# Patient Record
Sex: Female | Born: 1970 | Hispanic: Yes | Marital: Married | Smoking: Never smoker
Health system: Southern US, Community
[De-identification: ages and names within clinical notes are randomized; demographics above are authoritative.]

---

## 2010-02-13 ENCOUNTER — Emergency Department: Payer: Self-pay | Admitting: Emergency Medicine

## 2011-03-26 ENCOUNTER — Ambulatory Visit: Payer: Self-pay | Admitting: Family Medicine

## 2013-05-11 ENCOUNTER — Ambulatory Visit: Payer: Self-pay | Admitting: Family Medicine

## 2014-08-02 ENCOUNTER — Ambulatory Visit: Payer: Self-pay | Admitting: Family Medicine

## 2016-02-18 ENCOUNTER — Other Ambulatory Visit: Payer: Self-pay | Admitting: Family Medicine

## 2016-02-18 DIAGNOSIS — Z1231 Encounter for screening mammogram for malignant neoplasm of breast: Secondary | ICD-10-CM

## 2016-03-02 ENCOUNTER — Ambulatory Visit: Payer: Self-pay

## 2016-03-03 ENCOUNTER — Ambulatory Visit
Admission: RE | Admit: 2016-03-03 | Discharge: 2016-03-03 | Disposition: A | Discharge status: Home / Self Care | Payer: Self-pay | Source: Ambulatory Visit | Attending: Family Medicine | Admitting: Family Medicine

## 2016-03-03 DIAGNOSIS — Z1231 Encounter for screening mammogram for malignant neoplasm of breast: Secondary | ICD-10-CM

## 2016-03-17 ENCOUNTER — Other Ambulatory Visit: Payer: Self-pay | Admitting: Family Medicine

## 2016-03-17 DIAGNOSIS — Z1231 Encounter for screening mammogram for malignant neoplasm of breast: Secondary | ICD-10-CM

## 2016-03-23 ENCOUNTER — Ambulatory Visit
Admission: RE | Admit: 2016-03-23 | Discharge: 2016-03-23 | Disposition: A | Discharge status: Home / Self Care | Payer: Self-pay | Source: Ambulatory Visit | Attending: Family Medicine | Admitting: Family Medicine

## 2016-03-23 DIAGNOSIS — Z1231 Encounter for screening mammogram for malignant neoplasm of breast: Secondary | ICD-10-CM | POA: Insufficient documentation

## 2016-03-30 ENCOUNTER — Other Ambulatory Visit: Payer: Self-pay | Admitting: Family Medicine

## 2016-03-30 DIAGNOSIS — N63 Unspecified lump in unspecified breast: Secondary | ICD-10-CM

## 2016-04-09 ENCOUNTER — Ambulatory Visit
Admission: RE | Admit: 2016-04-09 | Discharge: 2016-04-09 | Disposition: A | Discharge status: Home / Self Care | Payer: Self-pay | Source: Ambulatory Visit | Attending: Family Medicine | Admitting: Family Medicine

## 2016-04-09 DIAGNOSIS — N63 Unspecified lump in unspecified breast: Secondary | ICD-10-CM

## 2017-04-05 ENCOUNTER — Other Ambulatory Visit: Payer: Self-pay | Admitting: Family Medicine

## 2017-04-21 ENCOUNTER — Ambulatory Visit
Admission: RE | Admit: 2017-04-21 | Discharge: 2017-04-21 | Disposition: A | Discharge status: Home / Self Care | Payer: Self-pay | Source: Ambulatory Visit | Attending: Oncology | Admitting: Oncology

## 2017-04-21 ENCOUNTER — Ambulatory Visit: Payer: Self-pay | Attending: Oncology

## 2017-04-21 VITALS — BP 138/96 | HR 97 | Temp 97.3°F | Resp 18 | Ht 65.0 in | Wt 159.0 lb

## 2017-04-21 DIAGNOSIS — Z0000 Encounter for general adult medical examination without abnormal findings: Secondary | ICD-10-CM

## 2017-04-21 NOTE — Progress Notes (Signed)
Subjective:     Patient ID: Shelby Garcia, female   DOB: 1970/11/26, 46 y.o.   MRN: [id number]  HPI   Review of Systems     Objective:   Physical Exam  Pulmonary/Chest: Right breast exhibits no inverted nipple, no mass, no nipple discharge, no skin change and no tenderness. Left breast exhibits no inverted nipple, no mass, no nipple discharge, no skin change and no tenderness. Breasts are symmetrical.       Assessment:     46 year old hispanic patient presents for [facility] clinic visit.  Patient screened, and meets [facility] eligibility.  Patient does not have insurance, Medicare or Medicaid.  Handout given on Affordable Care Act.  Instructed patient on breast self-exam using teach back method.  CBE unremarkable. No mass or lump palpated.  Pelvic exam normal.  Shelby Garcia  Interpreted exam.    Plan:     Sent for bilateral screening mammogram.  Specimen collected for pap.

## 2017-04-26 LAB — PAP LB AND HPV HIGH-RISK
HPV, high-risk: NEGATIVE
PAP SMEAR COMMENT: 0

## 2017-04-27 NOTE — Progress Notes (Signed)
Letter mailed to patient to notify of normal mammogram, and pap smear results. Pap negative/negative.  Next pap due in 5 years. Patient to return for annual [facility] screening.  Copy to [facility].

## 2018-06-29 ENCOUNTER — Encounter (INDEPENDENT_AMBULATORY_CARE_PROVIDER_SITE_OTHER): Payer: Self-pay

## 2018-06-29 ENCOUNTER — Ambulatory Visit
Admission: RE | Admit: 2018-06-29 | Discharge: 2018-06-29 | Disposition: A | Discharge status: Home / Self Care | Payer: Self-pay | Source: Ambulatory Visit | Attending: Oncology | Admitting: Oncology

## 2018-06-29 ENCOUNTER — Encounter: Payer: Self-pay | Admitting: *Deleted

## 2018-06-29 ENCOUNTER — Ambulatory Visit: Payer: Self-pay | Attending: Oncology | Admitting: *Deleted

## 2018-06-29 VITALS — BP 157/108 | HR 86 | Temp 97.8°F | Resp 12 | Ht 65.0 in | Wt 155.0 lb

## 2018-06-29 DIAGNOSIS — Z0000 Encounter for general adult medical examination without abnormal findings: Secondary | ICD-10-CM | POA: Insufficient documentation

## 2018-06-29 NOTE — Patient Instructions (Addendum)
Gave patient hand-out, Women Staying Healthy, Active and Well from [facility], with education on breast health, pap smears, heart and colon health. Hipertensin Hypertension La hipertensin, conocida comnmente como presin arterial alta, se produce cuando la sangre bombea en las arterias con mucha fuerza. Las arterias son los vasos sanguneos que transportan la sangre desde el corazn al resto del cuerpo. La hipertensin hace que el corazn haga ms esfuerzo para bombear sangre y puede provocar que las arterias se estrechen o endurezcan. La hipertensin no tratada o no controlada puede causar infarto de miocardio, accidentes cerebrovasculares, enfermedad renal y otros problemas. Una lectura de la presin arterial consiste de un nmero ms alto sobre un nmero ms bajo. En condiciones ideales, la presin arterial debe estar por debajo de 120/80. El primer nmero ("superior") es la presin sistlica. Es la medida de la presin de las arterias cuando el corazn late. El segundo nmero ("inferior") es la presin diastlica. Es la medida de la presin en las arterias cuando el corazn se relaja. Cules son las causas? Se desconoce la causa de esta afeccin. Qu incrementa el riesgo? Algunos factores de riesgo de hipertensin estn bajo su control. Otros no. Factores que puede modificar  Fumar.  Tener diabetes mellitus tipo 2, colesterol alto, o ambos.  No hacer la cantidad suficiente de actividad fsica o ejercicio.  Tener sobrepeso.  Consumir mucha grasa, azcar, caloras o sal (sodio) en su dieta.  Beber alcohol en exceso. Factores que son difciles o imposibles de modificar  Tener enfermedad renal crnica.  Tener antecedentes familiares de presin arterial alta.  La edad. Los riesgos aumentan con la edad.  La raza. El riesgo es mayor para las personas afroamericanas.  El sexo. Antes de los 45aos, los hombres corren ms riesgo que las mujeres. Despus de los 65aos, las mujeres corren  ms riesgo que los hombres.  Tener apnea obstructiva del sueo.  El estrs. Cules son los signos o los sntomas? La presin arterial extremadamente alta (crisis hipertensiva) puede provocar:  Dolor de cabeza.  Ansiedad.  Falta de aire.  Hemorragia nasal.  Nuseas y vmitos.  Dolor de pecho intenso.  Una crisis de movimientos que no puede controlar (convulsiones).  Cmo se diagnostica? Esta afeccin se diagnostica midiendo su presin arterial mientras se encuentra sentado, con el brazo apoyado sobre una superficie. El brazalete del tensimetro debe colocarse directamente sobre la piel de la parte superior del brazo y al nivel de su corazn. Debe medirla al menos dos veces en el mismo brazo. Determinadas condiciones pueden causar una diferencia de presin arterial entre el brazo izquierdo y el derecho. Ciertos factores pueden provocar que las lecturas de la presin arterial sean inferiores o superiores a lo normal (elevadas) por un perodo corto de tiempo:  Si su presin arterial es ms alta cuando se encuentra en el consultorio del mdico que cuando la mide en su hogar, se denomina "hipertensin de bata blanca". La mayora de las personas que tienen esta afeccin no deben ser medicadas.  Si su presin arterial es ms alta en el hogar que cuando se encuentra en el consultorio del mdico, se denomina "hipertensin enmascarada". La mayora de las personas que tienen esta afeccin deben ser medicadas para controlar la presin arterial.  Si tiene una lecturas de presin arterial alta durante una visita o si tiene presin arterial normal con otros factores de riesgo:  Es posible que se le pida que regrese otro da para volver a controlar su presin arterial.  Se le puede pedir que   se controle la presin arterial en su casa durante 1 semana o ms.  Si se le diagnostica hipertensin, es posible que se le realicen otros anlisis de sangre o estudios de diagnstico por imgenes para  ayudar a su mdico a comprender su riesgo general de tener otras afecciones. Cmo se trata? Esta afeccin se trata haciendo cambios saludables en el estilo de vida, tales como ingerir alimentos saludables, realizar ms ejercicio y reducir el consumo de alcohol. El mdico puede recetarle medicamentos si los cambios en el estilo de vida no son suficientes para lograr controlar la presin arterial y si:  Su presin arterial sistlica est por encima de 130.  Su presin arterial diastlica est por encima de 80.  La presin arterial deseada puede variar en funcin de las enfermedades, la edad y otros factores personales. Siga estas instrucciones en su casa: Comida y bebida  Siga una dieta con alto contenido de fibras y potasio, y con bajo contenido de sodio, azcar agregada y grasas. Un ejemplo de plan alimenticio es la dieta DASH (Dietary Approaches to Stop Hypertension, Mtodos alimenticios para detener la hipertensin). Para alimentarse de esta manera: ? Coma mucha fruta y verdura fresca. Trate de que la mitad del plato de cada comida sea de frutas y verduras. ? Coma cereales integrales, como pasta integral, arroz integral y pan integral. Llene aproximadamente un cuarto del plato con cereales integrales. ? Coma y beba productos lcteos con bajo contenido de grasa, como leche descremada o yogur bajo en grasas. ? Evite la ingesta de cortes de carne grasa, carne procesada o curada, y carne de ave con piel. Llene aproximadamente un cuarto del plato con protenas magras, como pescado, pollo sin piel, frijoles, huevos y tofu. ? Evite ingerir alimentos prehechos o procesados. En general, estos tienen mayor cantidad de sodio, azcar agregada y grasa.  Reduzca su ingesta diaria de sodio. La mayora de las personas que tienen hipertensin deben comer menos de 1500 mg de sodio por da.  Limite el consumo de alcohol a no ms de 1 medida por da si es mujer y no est embarazada y a 2 medidas por da si es  hombre. Una medida equivale a 12onzas de cerveza, 5onzas de vino o 1onzas de bebidas alcohlicas de alta graduacin. Estilo de vida  Trabaje con su mdico para mantener un peso saludable o perder peso. Pregntele cual es su peso recomendado.  Realice al menos 30 minutos de ejercicio que haga que se acelere su corazn (ejercicio aerbico) la mayora de los das de la semana. Estas actividades pueden incluir caminar, nadar o andar en bicicleta.  Incluya ejercicios para fortalecer sus msculos (ejercicios de resistencia), como pilates o levantamiento de pesas, como parte de su rutina semanal de ejercicios. Intente realizar 30minutos de este tipo de ejercicios al menos tres das a la semana.  No consuma ningn producto que contenga nicotina o tabaco, como cigarrillos y cigarrillos electrnicos. Si necesita ayuda para dejar de fumar, consulte al mdico.  Contrlese la presin arterial en su casa segn las indicaciones del mdico.  Concurra a todas las visitas de control como se lo haya indicado el mdico. Esto es importante. Medicamentos  Tome los medicamentos de venta libre y los recetados solamente como se lo haya indicado el mdico. Siga cuidadosamente las indicaciones. Los medicamentos para la presin arterial deben tomarse segn las indicaciones.  No omita las dosis de medicamentos para la presin arterial. Si lo hace, estar en riesgo de tener problemas y puede hacer que los medicamentos   sean menos eficaces.  Pregntele a su mdico a qu efectos secundarios o reacciones a los medicamentos debe prestar atencin. Comunquese con un mdico si:  Piensa que tiene una reaccin a un medicamento que est tomando.  Tiene dolores de cabeza frecuentes (recurrentes).  Siente mareos.  Tiene hinchazn en los tobillos.  Tiene problemas de visin. Solicite ayuda de inmediato si:  Siente un dolor de cabeza intenso o confusin.  Siente debilidad inusual o adormecimiento.  Siente que va a  desmayarse.  Siente un dolor intenso en el pecho o el abdomen.  Vomita repetidas veces.  Tiene dificultad para respirar. Resumen  La hipertensin se produce cuando la sangre bombea en las arterias con mucha fuerza. Si esta afeccin no se controla, podra correr riesgo de tener complicaciones graves.  La presin arterial deseada puede variar en funcin de las enfermedades, la edad y otros factores personales. Para la mayora de las personas, una presin arterial normal es menor que 120/80.  La hipertensin se trata con cambios en el estilo de vida, medicamentos o una combinacin de ambos. Los cambios en el estilo de vida incluyen prdida de peso, ingerir alimentos sanos, seguir una dieta baja en sodio, hacer ms ejercicio y limitar el consumo de alcohol. Esta informacin no tiene como fin reemplazar el consejo del mdico. Asegrese de hacerle al mdico cualquier pregunta que tenga. Document Released: 11/09/2005 Document Revised: 10/21/2016 Document Reviewed: 10/21/2016 Elsevier Interactive Patient Education  2018 Elsevier Inc.  

## 2018-06-29 NOTE — Progress Notes (Signed)
  Subjective:     Patient ID: Norwood LevoMa Luisa Keddy, female   DOB: 05-21-1971, 47 y.o.   MRN: [id number]  HPI   Review of Systems     Objective:   Physical Exam  Pulmonary/Chest: Right breast exhibits no inverted nipple, no mass, no nipple discharge, no skin change and no tenderness. Left breast exhibits no inverted nipple, no mass, no nipple discharge, no skin change and no tenderness.       Assessment:     47 [facility] Hispanic [facility]  Lloyda, the interpreter present during the interview and exam.  Clinical breast exam unremarkable.  Taught self breast awareness.  Last pap on 04/21/17 was negative / negative.  Next pap due in 2023. Blood pressure elevated at  157/108.  She is to recheck her blood pressure at [facility] or [facility], and if remains higher than 140/90 she is to follow-up with her primary care provider.  Hand out on hypertention given to patient.  Patient has been screened for eligibility.  She does not have any insurance, Medicare or Medicaid.  She also meets financial eligibility.  Hand-out given on the Affordable Care Act. Risk Assessment    Risk Scores      06/29/2018   Last edited by: Vanita PandaMiceli, Loretta T, RN   5-year risk: 0.5 %   Lifetime risk: 5.3 %            Plan:     Screening mammogram ordered.  Will follow-up per [facility] protocol.

## 2018-06-29 NOTE — Progress Notes (Signed)
Patints BP elevated. Pateint instructed to recheck blood pressure and contact PCP if it remains elevated. No complaints of head ach.

## 2018-06-30 ENCOUNTER — Encounter: Payer: Self-pay | Admitting: *Deleted

## 2018-06-30 NOTE — Progress Notes (Signed)
Letter mailed from [facility] to inform patient of her normal mammogram results.  Patient is to follow-up with annual screening in one year.  [facility] to [facility]. 

## 2020-05-15 ENCOUNTER — Other Ambulatory Visit: Payer: Self-pay

## 2020-05-15 ENCOUNTER — Ambulatory Visit
Admission: RE | Admit: 2020-05-15 | Discharge: 2020-05-15 | Disposition: A | Discharge status: Home / Self Care | Payer: Self-pay | Source: Ambulatory Visit | Attending: Oncology | Admitting: Oncology

## 2020-05-15 ENCOUNTER — Ambulatory Visit: Payer: Self-pay | Attending: Oncology | Admitting: *Deleted

## 2020-05-15 VITALS — BP 145/87 | HR 85 | Temp 96.8°F | Resp 20 | Ht 64.0 in | Wt 159.6 lb

## 2020-05-15 DIAGNOSIS — Z0000 Encounter for general adult medical examination without abnormal findings: Secondary | ICD-10-CM

## 2020-05-15 NOTE — Progress Notes (Signed)
Left Breast feels burning sensation on the side comes with movement. One aunt from mother side they removed uterus. (Not sure of reason why)

## 2020-05-15 NOTE — Progress Notes (Signed)
  Subjective:     Patient ID: Shelby Garcia, female   DOB: February 03, 1971, 49 y.o.   MRN: [id number]  HPI   Review of Systems     Objective:   Physical Exam Chest:     Breasts:        Right: No swelling, bleeding, inverted nipple, mass, nipple discharge, skin change or tenderness.        Left: No swelling, bleeding, inverted nipple, mass, nipple discharge, skin change or tenderness.     Comments: Complains of left breast burning sensation that radiates to her nipple when "picking up things." Lymphadenopathy:     Upper Body:     Right upper body: No supraclavicular or axillary adenopathy.     Left upper body: No supraclavicular or axillary adenopathy.        Assessment:     49 year old Hispanic female returns to [facility] for annual screening.  Loyda, the interpreter present during the interview and exam.  Patient complains of burning sensation in the left breast that radiates from the lateral breast to her nipple when lifting objects.  States it only last for seconds, and has been happening over the last few weeks.  States she does do a "lot of exercising".  On clinical breast exam there is no dominant mass, skin changes, nipple discharge, or lymphadenopathy.  Taught self breast awareness.  Last pap on 06/29/18 was negative / negative.  Next pap due in 2024.  Patient has been screened for eligibility.  She does not have any insurance, Medicare or Medicaid.  She also meets financial eligibility.   Risk Assessment    Risk Scores      05/15/2020 06/29/2018   Last edited by: Neita Garnet, CMA Vanita Panda, RN   5-year risk: 0.6 % 0.5 %   Lifetime risk: 5.2 % 5.3 %            Plan:     Since the breast pain sounds muscular in nature will proceed with screening mammogram.  I have scheduled patient to return on 07/03/20 @ 11:00 for a repeat clinical breast exam.  Will follow up per [facility] protocol.

## 2020-05-15 NOTE — Patient Instructions (Signed)
Gave patient hand-out, Women Staying Healthy, Active and Well from [facility], with education on breast health, pap smears, heart and colon health. 

## 2020-05-17 ENCOUNTER — Encounter: Payer: Self-pay | Admitting: *Deleted

## 2020-05-17 NOTE — Progress Notes (Signed)
Letter mailed from [facility] to inform patient of her normal mammogram results.  Patient is to follow-up with annual screening in one yea.   Patient returning in 6 weeks for repeat clinical breast exam to reassess her breast pain.

## 2020-07-03 ENCOUNTER — Ambulatory Visit: Payer: Self-pay | Attending: Oncology | Admitting: *Deleted

## 2020-07-03 ENCOUNTER — Other Ambulatory Visit: Payer: Self-pay

## 2020-07-03 VITALS — BP 154/96 | HR 89 | Temp 98.7°F | Ht 64.16 in | Wt 163.1 lb

## 2020-07-03 DIAGNOSIS — N644 Mastodynia: Secondary | ICD-10-CM

## 2020-07-03 NOTE — Patient Instructions (Signed)
Hipertensión en los adultos °Hypertension, Adult °La presión arterial alta (hipertensión) se produce cuando la fuerza de la sangre bombea a través de las arterias con mucha fuerza. Las arterias son los vasos sanguíneos que transportan la sangre desde el corazón al resto del cuerpo. La hipertensión hace que el corazón haga más esfuerzo para bombear sangre y puede provocar que las arterias se estrechen o endurezcan. La hipertensión no tratada o no controlada puede causar infarto de miocardio, insuficiencia cardíaca, accidente cerebrovascular, enfermedad renal y otros problemas. °Una lectura de la presión arterial consta de un número más alto sobre un número más bajo. En condiciones ideales, la presión arterial debe estar por debajo de 120/80. El primer número (“superior”) es la presión sistólica. Es la medida de la presión de las arterias cuando el corazón late. El segundo número (“inferior”) es la presión diastólica. Es la medida de la presión en las arterias cuando el corazón se relaja. °¿Cuáles son las causas? °Se desconoce la causa exacta de esta afección. Hay algunas afecciones que causan presión arterial alta o están relacionadas con ella. °¿Qué incrementa el riesgo? °Algunos factores de riesgo de hipertensión están bajo su control. Los siguientes factores pueden hacer que sea más propenso a desarrollar esta afección: °· Fumar. °· Tener diabetes mellitus tipo 2, colesterol alto, o ambos. °· No hacer la cantidad suficiente de actividad física o ejercicio. °· Tener sobrepeso. °· Consumir mucha grasa, azúcar, calorías o sal (sodio) en su dieta. °· Beber alcohol en exceso. °Algunos factores de riesgo para la presión arterial alta pueden ser difíciles o imposibles de cambiar. Algunos de estos factores son los siguientes: °· Tener enfermedad renal crónica. °· Tener antecedentes familiares de presión arterial alta. °· Edad. Los riesgos aumentan con la edad. °· Raza. El riesgo es mayor para las personas  afroamericanas. °· Sexo. Antes de los 45 años, los hombres corren más riesgo que las mujeres. Después de los 65 años, las mujeres corren más riesgo que los hombres. °· Tener apnea obstructiva del sueño. °· Estrés. °¿Cuáles son los signos o los síntomas? °Es posible que la presión arterial alta puede no cause síntomas. La presión arterial muy alta (crisis hipertensiva) puede provocar: °· Dolor de cabeza. °· Ansiedad. °· Falta de aire. °· Hemorragia nasal. °· Náuseas y vómitos. °· Cambios en la visión. °· Dolor de pecho intenso. °· Convulsiones. °¿Cómo se diagnostica? °Esta afección se diagnostica al medir su presión arterial mientras se encuentra sentado, con el brazo apoyado sobre una superficie plana, las piernas sin cruzar y los pies bien apoyados en el piso. El brazalete del tensiómetro debe colocarse directamente sobre la piel de la parte superior del brazo y al nivel de su corazón. Debe medirla al menos dos veces en el mismo brazo. Determinadas condiciones pueden causar una diferencia de presión arterial entre el brazo izquierdo y el derecho. °Ciertos factores pueden provocar que las lecturas de la presión arterial sean inferiores o superiores a lo normal por un período corto de tiempo: °· Si su presión arterial es más alta cuando se encuentra en el consultorio del médico que cuando la mide en su hogar, se denomina “hipertensión de bata blanca”. La mayoría de las personas que tienen esta afección no deben ser medicadas. °· Si su presión arterial es más alta en el hogar que cuando se encuentra en el consultorio del médico, se denomina “hipertensión enmascarada”. La mayoría de las personas que tienen esta afección deben ser medicadas para controlar la presión arterial. °Si tiene una lecturas de presión arterial alta durante   una visita o si tiene presión arterial normal con otros factores de riesgo, se le podrá pedir que haga lo siguiente: °· Que regrese otro día para volver a controlar su presión arterial  nuevamente. °· Que se controle la presión arterial en su casa durante 1 semana o más. °Si se le diagnostica hipertensión, es posible que se le realicen otros análisis de sangre o estudios de diagnóstico por imágenes para ayudar a su médico a comprender su riesgo general de tener otras afecciones. °¿Cómo se trata? °Esta afección se trata haciendo cambios saludables en el estilo de vida, tales como ingerir alimentos saludables, realizar más ejercicio y reducir el consumo de alcohol. El médico puede recetarle medicamentos si los cambios en el estilo de vida no son suficientes para lograr controlar la presión arterial y si: °· Su presión arterial sistólica está por encima de 130. °· Su presión arterial diastólica está por encima de 80. °La presión arterial deseada puede variar en función de las enfermedades, la edad y otros factores personales. °Siga estas instrucciones en su casa: °Comida y bebida ° °· Siga una dieta con alto contenido de fibras y potasio, y con bajo contenido de sodio, azúcar agregada y grasas. Un ejemplo de plan alimenticio es la dieta DASH (Dietary Approaches to Stop Hypertension, Métodos alimenticios para detener la hipertensión). Para alimentarse de esta manera: °? Coma mucha fruta y verdura fresca. Trate de que la mitad del plato de cada comida sea de frutas y verduras. °? Coma cereales integrales, como pasta integral, arroz integral o pan integral. Llene aproximadamente un cuarto del plato con cereales integrales. °? Coma y beba productos lácteos con bajo contenido de grasa, como leche descremada o yogur bajo en grasas. °? Evite la ingesta de cortes de carne grasa, carne procesada o curada, y carne de ave con piel. Llene aproximadamente un cuarto del plato con proteínas magras, como pescado, pollo sin piel, frijoles, huevos o tofu. °? Evite ingerir alimentos prehechos y procesados. En general, estos tienen mayor cantidad de sodio, azúcar agregada y grasa. °· Reduzca su ingesta diaria de sodio.  La mayoría de las personas que tienen hipertensión deben comer menos de 1500 mg de sodio por día. °· No beba alcohol si: °? Su médico le indica no hacerlo. °? Está embarazada, puede estar embarazada o está tratando de quedar embarazada. °· Si bebe alcohol: °? Limite la cantidad que bebe a lo siguiente: °§ De 0 a 1 medida por día para las mujeres. °§ De 0 a 2 medidas por día para los hombres. °? Esté atento a la cantidad de alcohol que hay en las bebidas que toma. En [place], una medida equivale a una botella de cerveza de 12 oz (355 ml), un vaso de vino de 5 oz (148 ml) o un vaso de una bebida alcohólica de alta graduación de 1½ oz (44 ml). °Estilo de vida ° °· Trabaje con su médico para mantener un peso saludable o perder peso. Pregúntele cuál es el peso recomendado para usted. °· Haga al menos 30 minutos de ejercicio la mayoría de los días de la semana. Estas actividades pueden incluir caminar, nadar o andar en bicicleta. °· Incluya ejercicios para fortalecer sus músculos (ejercicios de resistencia), como [place] o levantamiento de pesas, como parte de su rutina semanal de ejercicios. Intente realizar 30 minutos de este tipo de ejercicios al menos tres días a la semana. °· No consuma ningún producto que contenga nicotina o tabaco, como cigarrillos, cigarrillos electrónicos y tabaco de mascar. Si necesita ayuda para dejar de fumar, consulte al   médico. °· Contrólese la presión arterial en su casa según las indicaciones del médico. °· Concurra a todas las visitas de seguimiento como se lo haya indicado el médico. Esto es importante. °Medicamentos °· Tome los medicamentos de venta libre y los recetados solamente como se lo haya indicado el médico. Siga cuidadosamente las indicaciones. Los medicamentos para la presión arterial deben tomarse según las indicaciones. °· No omita las dosis de medicamentos para la presión arterial. Si lo hace, estará en riesgo de tener problemas y puede hacer que los medicamentos  sean menos eficaces. °· Pregúntele a su médico a qué efectos secundarios o reacciones a los medicamentos debe prestar atención. °Comuníquese con un médico si: °· Piensa que tiene una reacción a un medicamento que está tomando. °· Tiene dolores de cabeza frecuentes (recurrentes). °· Se siente mareado. °· Tiene hinchazón en los tobillos. °· Tiene problemas de visión. °Solicite ayuda inmediatamente si: °· Siente un dolor de cabeza intenso o confusión. °· Siente debilidad inusual o adormecimiento. °· Siente que va a desmayarse. °· Siente un dolor intenso en el pecho o el abdomen. °· Vomita repetidas veces. °· Tiene dificultad para respirar. °Resumen °· La hipertensión se produce cuando la sangre bombea en las arterias con mucha fuerza. Si esta afección no se controla, podría correr riesgo de tener complicaciones graves. °· La presión arterial deseada puede variar en función de las enfermedades, la edad y otros factores personales. Para la mayoría de las personas, una presión arterial normal es menor que 120/80. °· La hipertensión se trata con cambios en el estilo de vida, medicamentos o una combinación de ambos. Los cambios en el estilo de vida incluyen pérdida de peso, ingerir alimentos sanos, seguir una dieta baja en sodio, hacer más ejercicio y limitar el consumo de alcohol. °Esta información no tiene como fin reemplazar el consejo del médico. Asegúrese de hacerle al médico cualquier pregunta que tenga. °Document Revised: 08/25/2018 Document Reviewed: 08/25/2018 °Elsevier Patient Education © 2020 Elsevier Inc. ° °

## 2020-07-03 NOTE — Progress Notes (Signed)
  Subjective:     Patient ID: Shelby Garcia, female   DOB: [birth year], 49 y.o.   MRN: [id number]  HPI   Review of Systems     Objective:   Physical Exam Chest:     Breasts:        Right: No swelling, bleeding, inverted nipple, mass, nipple discharge, skin change or tenderness.        Left: Tenderness present. No swelling, bleeding, inverted nipple, mass, nipple discharge or skin change.    Lymphadenopathy:     Upper Body:     Right upper body: No supraclavicular or axillary adenopathy.     Left upper body: No supraclavicular or axillary adenopathy.        Assessment:     49 year old Hispanic female returns to [facility] for repeat clinical breast exam only.  Loyda, the interpreter present during the interview and exam.  Patient was last seen on 05/15/20 in the [facility] clinic.  She was complaining of intermittent left breast pain at that time.  Normal mammogram.  She returns today for reassessment of her left breast pain.  Patient states she has occasional left breast pain that radiates from her axilla to the nipple.  It only happens when she lifts something or opens the microwave door.  States it is unchanged since her visit in June.  On clinical breast exam there is no dominant mass, skin changes, nipple discharge or lymphadenopathy.   Blood pressure elevated at 156/94 and rechecked at 134/84.  She is to recheck her blood pressure at [facility] or [facility], and if remains higher than 140/90 she is to follow-up with her primary care provider.  Hand out on hypertention given to patient.    Plan:     Since the patient had a normal mammogram and the pain is only with exertion, I feel the pain is probably more muscular in nature.  I have encouraged her to follow up with her primary care provider if the pain persist.  She is agreeable to the plan.

## 2021-06-04 ENCOUNTER — Other Ambulatory Visit: Payer: Self-pay

## 2021-06-04 ENCOUNTER — Ambulatory Visit
Admission: RE | Admit: 2021-06-04 | Discharge: 2021-06-04 | Disposition: A | Discharge status: Home / Self Care | Payer: Self-pay | Source: Ambulatory Visit | Attending: Oncology | Admitting: Oncology

## 2021-06-04 ENCOUNTER — Encounter: Payer: Self-pay | Admitting: *Deleted

## 2021-06-04 ENCOUNTER — Ambulatory Visit: Payer: Self-pay | Attending: Oncology | Admitting: *Deleted

## 2021-06-04 VITALS — BP 136/91 | HR 76 | Temp 96.4°F | Ht 64.0 in | Wt 156.0 lb

## 2021-06-04 DIAGNOSIS — Z0000 Encounter for general adult medical examination without abnormal findings: Secondary | ICD-10-CM

## 2021-06-04 NOTE — Progress Notes (Signed)
  Subjective:     Patient ID: Shelby Garcia, female   DOB: [birth year], 50 y.o.   MRN: [id number]  HPI  [facility] Medical History Record - 06/04/21 0912       Breast History   Screening cycle New    Provider ([facility]) Dr. Zada Finders    Initial Mammogram 06/04/21    Last Mammogram Annual    Last Mammogram Date 05/15/20    Provider (Mammogram)  [facility]    Recent Breast Symptoms None      Breast Cancer History   Breast Cancer History No personal or family history      Previous History of Breast Problems   Breast Surgery or Biopsy None    Breast Implants N/A    BSE Done Monthly      Gynecological/Obstetrical History   LMP --   50 yo   Is there any chance that the client could be pregnant?  No    Age at menarche 50    Age at menopause 50    PAP smear history Annually    Date of last PAP  04/21/17   neg/neg   Provider (PAP) [facility]    Age at first live birth 12    Breast fed children Yes (type length in comments)   1.5 years   DES Exposure No    Cervical, Uterine or Ovarian cancer No    Family history of Cervial, Uterine or Ovarian cancer Yes   maternal aunt . ? uterine   Hysterectomy No    Cervix removed No    Ovaries removed No    Laser/Cryosurgery No    Current method of birth control None    Current method of Estrogen/Hormone replacement None    Smoking history None    Comments no insurance                 Review of Systems     Objective:   Physical Exam Chest:  Breasts:    Right: No swelling, bleeding, inverted nipple, mass, nipple discharge, skin change, tenderness, axillary adenopathy or supraclavicular adenopathy.     Left: No swelling, bleeding, inverted nipple, mass, nipple discharge, skin change, tenderness, axillary adenopathy or supraclavicular adenopathy.    Lymphadenopathy:     Upper Body:     Right upper body: No supraclavicular or axillary adenopathy.     Left upper body: No supraclavicular or axillary adenopathy.      Assessment:     50 year old  Hispanic female returns to [facility] for annual screening.  Shelby Garcia, the interpreter present during the interview and exam.  Clinical breast exam without dominant mass, skin changes, nipple discharge or lymphadenopathy.  Taught self breast awareness.  Last pap on 04/21/17 was negative / negative.  Next pap due in 2023.  Patient has been screened for eligibility.  She does not have any insurance, Medicare or Medicaid.  She also meets financial eligibility.   Risk Assessment   No risk assessment data for the current encounter  Risk Scores       05/15/2020   Last edited by: Neita Garnet, CMA   5-year risk: 0.6 %   Lifetime risk: 5.2 %               Plan:     Screening mammogram ordered.  Will follow up per [facility] protocol.

## 2021-06-04 NOTE — Patient Instructions (Signed)
Gave patient hand-out, Women Staying Healthy, Active and Well from [facility], with education on breast health, pap smears, heart and colon health. 

## 2021-06-19 ENCOUNTER — Encounter: Payer: Self-pay | Admitting: *Deleted

## 2021-06-19 NOTE — Progress Notes (Signed)
Letter mailed from the [facility] to inform patient of her normal mammogram results.  Patient is to follow-up with annual screening in one year. 

## 2022-07-21 ENCOUNTER — Ambulatory Visit: Payer: Self-pay

## 2022-10-08 ENCOUNTER — Other Ambulatory Visit: Payer: Self-pay

## 2022-10-08 DIAGNOSIS — Z1231 Encounter for screening mammogram for malignant neoplasm of breast: Secondary | ICD-10-CM

## 2022-10-13 ENCOUNTER — Ambulatory Visit: Payer: Self-pay | Attending: Hematology and Oncology | Admitting: Hematology and Oncology

## 2022-10-13 ENCOUNTER — Ambulatory Visit
Admission: RE | Admit: 2022-10-13 | Discharge: 2022-10-13 | Disposition: A | Discharge status: Home / Self Care | Payer: Self-pay | Source: Ambulatory Visit | Attending: Obstetrics and Gynecology | Admitting: Obstetrics and Gynecology

## 2022-10-13 ENCOUNTER — Other Ambulatory Visit: Payer: Self-pay

## 2022-10-13 VITALS — BP 142/92 | Wt 161.8 lb

## 2022-10-13 DIAGNOSIS — Z1231 Encounter for screening mammogram for malignant neoplasm of breast: Secondary | ICD-10-CM

## 2022-10-13 DIAGNOSIS — Z01419 Encounter for gynecological examination (general) (routine) without abnormal findings: Secondary | ICD-10-CM

## 2022-10-13 NOTE — Progress Notes (Signed)
[name] is a 51 y.o. No obstetric history on file. female who presents to [facility] clinic today with no complaints.    Pap Smear: Pap smear completed today. Last Pap smear was 2018 at [facility] clinic and was normal. Per patient has no history of an abnormal Pap smear. Last Pap smear result is available in Epic.   Physical exam: Breasts Breasts symmetrical. No skin abnormalities bilateral breasts. No nipple retraction bilateral breasts. No nipple discharge bilateral breasts. No lymphadenopathy. No lumps palpated bilateral breasts.   MS DIGITAL SCREENING TOMO BILATERAL  Result Date: 06/06/2021 CLINICAL DATA:  Screening. EXAM: DIGITAL SCREENING BILATERAL MAMMOGRAM WITH TOMOSYNTHESIS AND CAD TECHNIQUE: Bilateral screening digital craniocaudal and mediolateral oblique mammograms were obtained. Bilateral screening digital breast tomosynthesis was performed. The images were evaluated with computer-aided detection. COMPARISON:  Previous exam(s). ACR Breast Density Category c: The breast tissue is heterogeneously dense, which may obscure small masses. FINDINGS: There are no findings suspicious for malignancy. IMPRESSION: No mammographic evidence of malignancy. A result letter of this screening mammogram will be mailed directly to the patient. RECOMMENDATION: Screening mammogram in one year. (Code:SM-B-01Y) BI-RADS CATEGORY  1: Negative. Electronically Signed   By: Annia Belt M.D.   On: 06/06/2021 12:24  MS DIGITAL SCREENING TOMO BILATERAL  Result Date: 05/16/2020 CLINICAL DATA:  Screening. EXAM: DIGITAL SCREENING BILATERAL MAMMOGRAM WITH TOMO AND CAD COMPARISON:  Previous exam(s). ACR Breast Density Category c: The breast tissue is heterogeneously dense, which may obscure small masses. FINDINGS: There are no findings suspicious for malignancy. Images were processed with CAD. IMPRESSION: No mammographic evidence of malignancy. A result letter of this screening mammogram will be mailed directly to the  patient. RECOMMENDATION: Screening mammogram in one year. (Code:SM-B-01Y) BI-RADS CATEGORY  1: Negative. Electronically Signed   By: Beckie Salts M.D.   On: 05/16/2020 17:05   MS DIGITAL SCREENING TOMO BILATERAL  Result Date: 06/29/2018 CLINICAL DATA:  Screening. EXAM: DIGITAL SCREENING BILATERAL MAMMOGRAM WITH TOMO AND CAD COMPARISON:  Previous exam(s). ACR Breast Density Category b: There are scattered areas of fibroglandular density. FINDINGS: There are no findings suspicious for malignancy. Images were processed with CAD. IMPRESSION: No mammographic evidence of malignancy. A result letter of this screening mammogram will be mailed directly to the patient. RECOMMENDATION: Screening mammogram in one year. (Code:SM-B-01Y) BI-RADS CATEGORY  1: Negative. Electronically Signed   By: Bary Richard M.D.   On: 06/29/2018 15:45        Pelvic/Bimanual Ext Genitalia No lesions, no swelling and no discharge observed on external genitalia.        Vagina Vagina pink and normal texture. No lesions or discharge observed in vagina.        Cervix Cervix is present. Cervix pink and of normal texture. No discharge observed.    Uterus Uterus is present and palpable. Uterus in normal position and normal size.        Adnexae Bilateral ovaries present and palpable. No tenderness on palpation.         Rectovaginal No rectal exam completed today since patient had no rectal complaints. No skin abnormalities observed on exam.     Smoking History: Patient has never smoked and was not referred to quit line.    Patient Navigation: Patient education provided. Access to services provided for patient through [facility] program. [employer] Alethia Berthold [phone]) interpreter provided. No transportation provided   Colorectal Cancer Screening: Per patient has never had colonoscopy completed No complaints today. FIT test negative 2022 with [facility]  Breast and Cervical Cancer Risk Assessment: Patient does not have family history of  breast cancer, known genetic mutations, or radiation treatment to the chest before age 47. Patient does not have history of cervical dysplasia, immunocompromised, or DES exposure in-utero.  Risk Assessment   No risk assessment data for the current encounter  Risk Scores       06/04/2021   Last edited by: Scarlett Presto, RN   5-year risk: 0.6 %   Lifetime risk: 5.1 %            A: [facility] exam with pap smear No complaints with benign exam.   P: [facility] for a [facility] mamm[facility]. Appointment scheduled 10/13/2022.  Ilda Basset A, NP 10/13/2022 2:28 PM 10/13/22

## 2022-10-13 NOTE — Patient Instructions (Signed)
Taught Shelby Garcia about self breast awareness. Patient did need a Pap smear today due to last Pap smear was in 2018 per patient. She will be due again in 2028. Let her know [facility] will cover Pap smears every 5 years unless has a history of abnormal Pap smears. Referred [facility] to the [facility] for screening mammogram. Appointment scheduled for 10/13/2022. Patient aware of appointment and will be there. Let patient know will follow up with her within the next couple weeks with results. Shelby Garcia verbalized understanding.  Pascal Lux, NP 2:31 PM

## 2022-10-16 LAB — CYTOLOGY - PAP
Comment: NEGATIVE
Diagnosis: NEGATIVE
High risk HPV: NEGATIVE

## 2022-10-23 ENCOUNTER — Telehealth: Payer: Self-pay

## 2022-10-23 NOTE — Telephone Encounter (Signed)
Via Delorise Royals, Spanish Interpreter [facility]) Patient informed negative Pap/HPV results, next pap due in 5 years, verbalized understanding.

## 2023-02-18 IMAGING — MG MM DIGITAL SCREENING BILAT W/ TOMO AND CAD
6 of 10 series · 6 of 30 positions shown · non-contrast
Comparison: Previous exam(s).

CLINICAL DATA: Screening.

EXAM:
DIGITAL SCREENING BILATERAL MAMMOGRAM WITH TOMOSYNTHESIS AND CAD
TECHNIQUE: Bilateral screening digital craniocaudal and mediolateral oblique
mammograms were obtained. Bilateral screening digital breast
tomosynthesis was performed. The images were evaluated with
computer-aided detection.

[L CC synth-2D (1 of 2)]
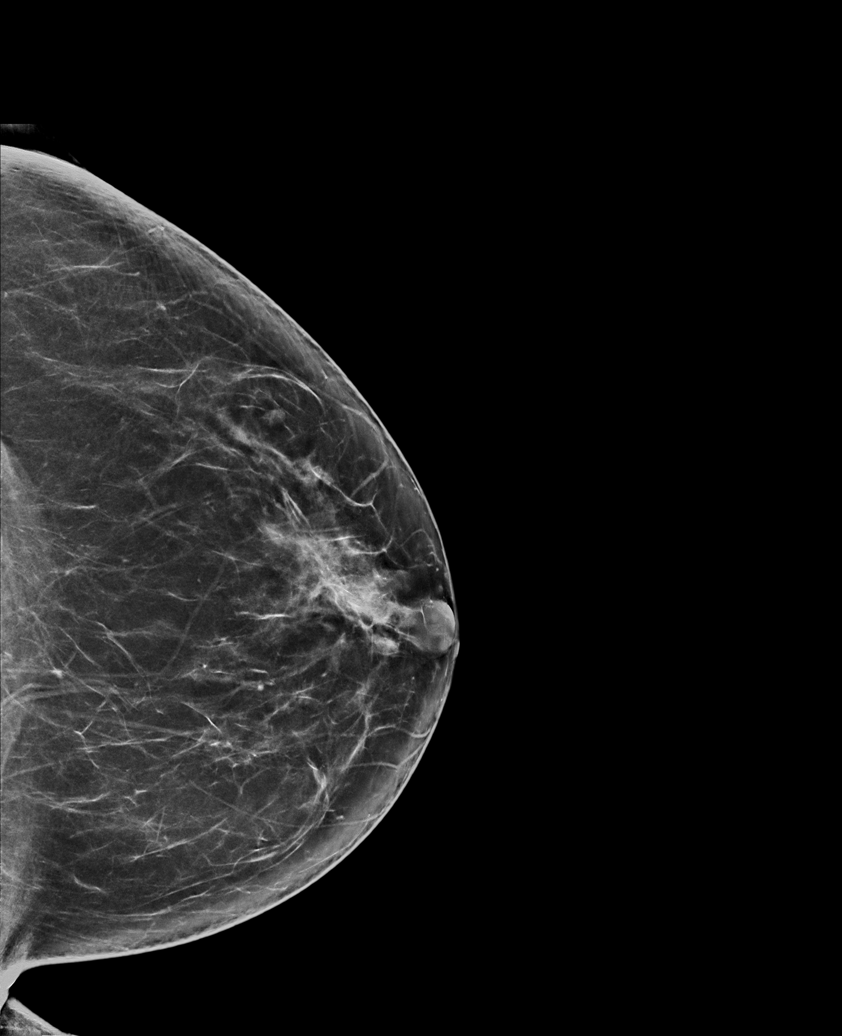

[L MLO synth-2D]
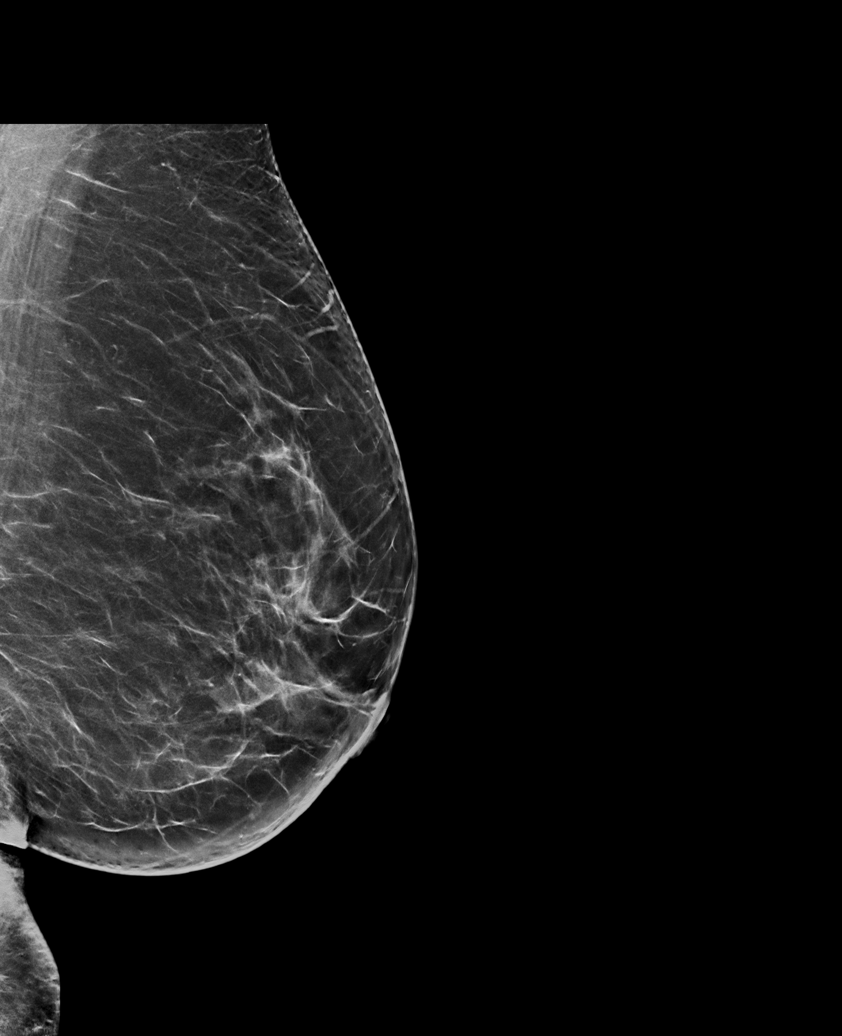

[L CC synth-2D (2 of 2)]
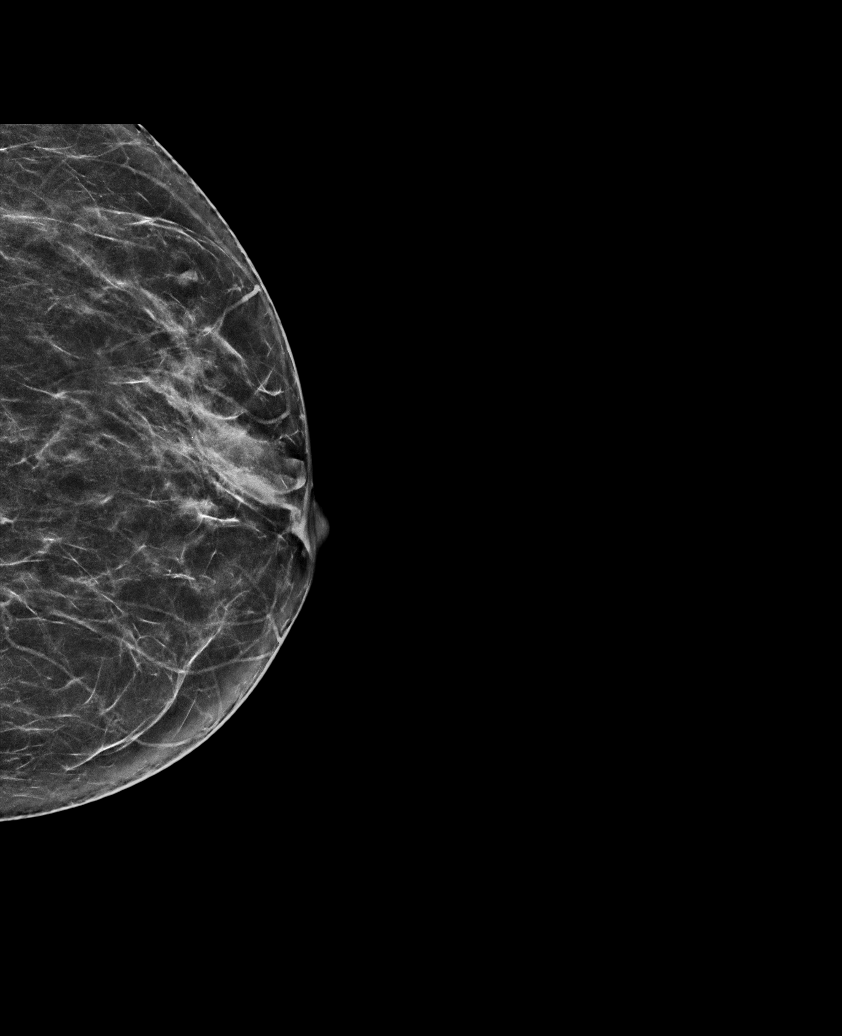

[R MLO synth-2D]
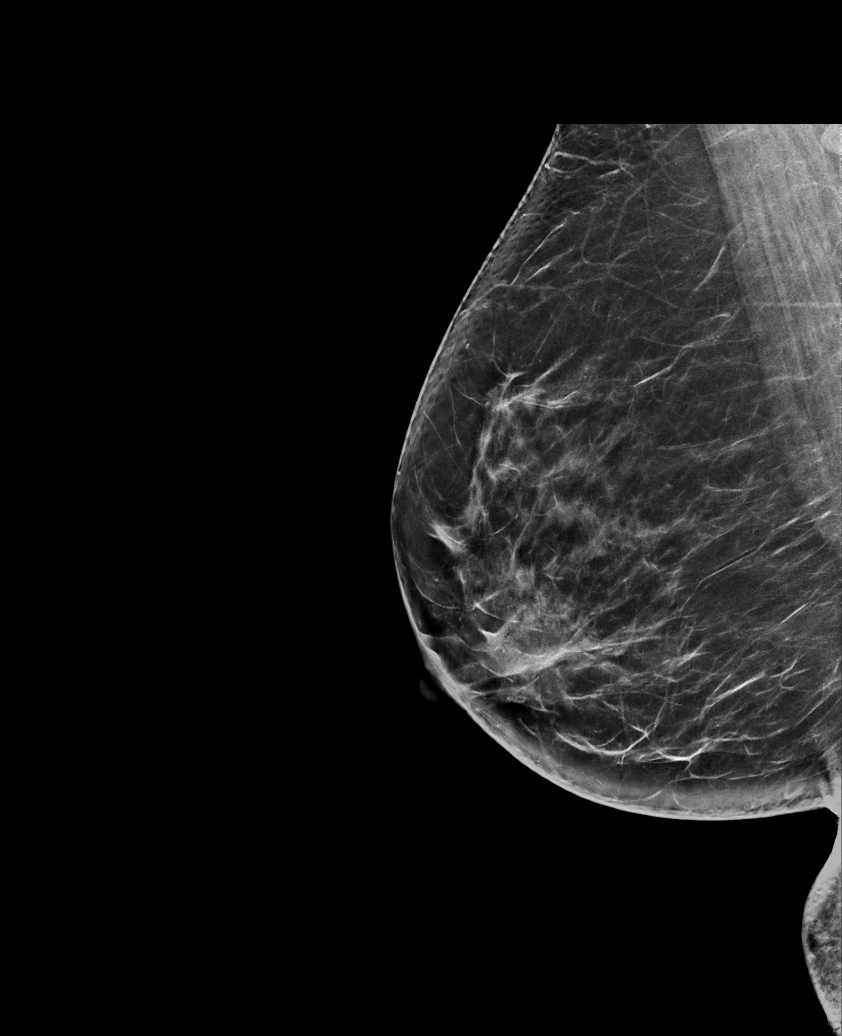

[R CC synth-2D]
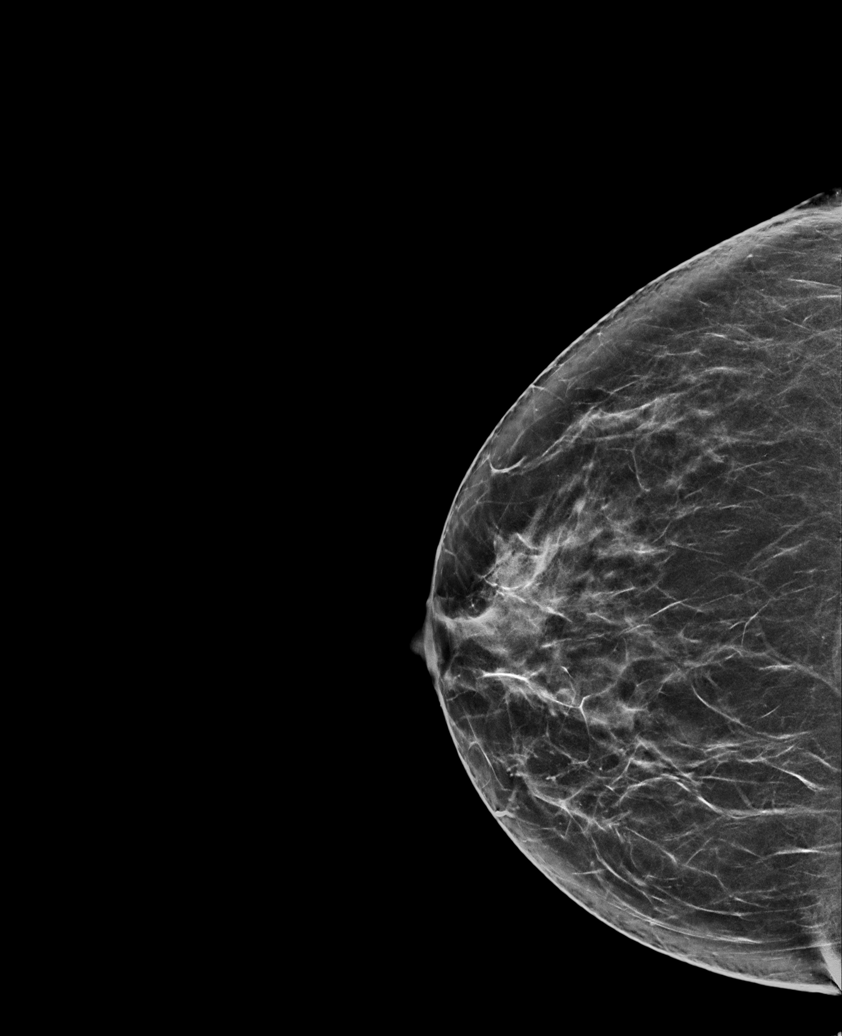

[R CC tomo · tomo slice 35/68.0]
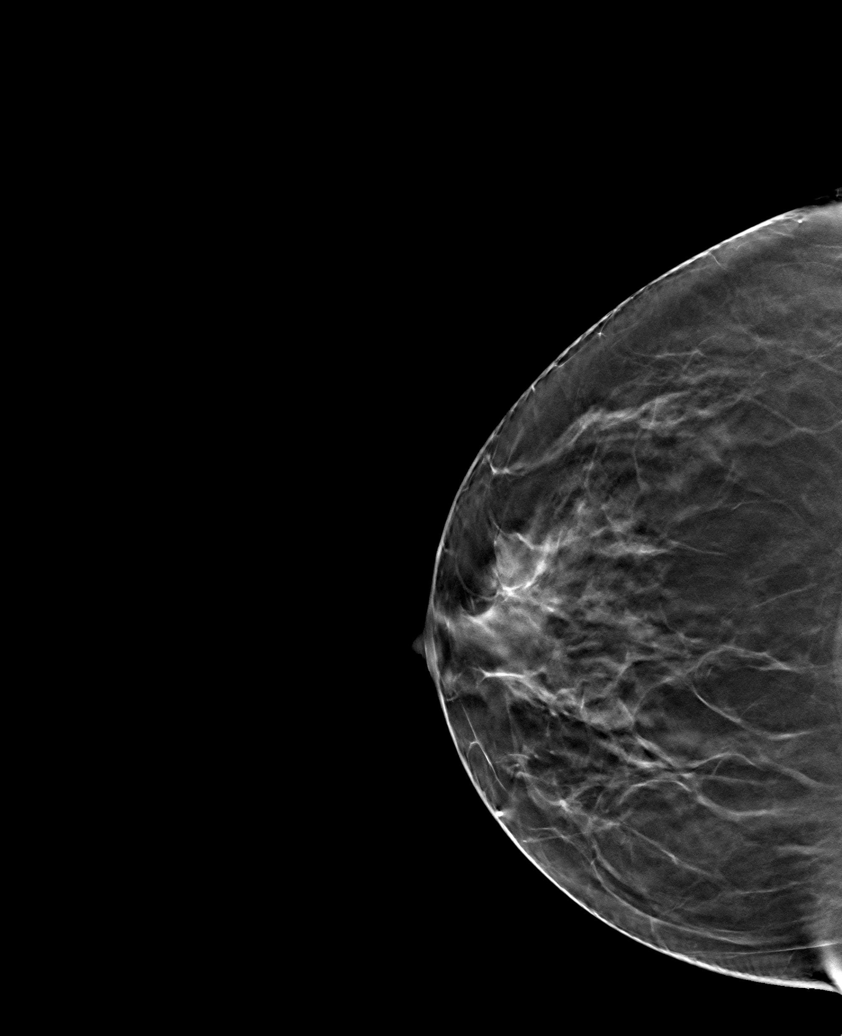

[6 of 30 positions shown; findings below may reference images not displayed]

ACR Breast Density Category c: The breast tissue is heterogeneously
dense, which may obscure small masses.
FINDINGS: There are no findings suspicious for malignancy.
IMPRESSION: No mammographic evidence of malignancy. A result letter of this
screening mammogram will be mailed directly to the patient.

RECOMMENDATION:
Screening mammogram in one year. (Code:[id number]-BC3)

BI-RADS CATEGORY  1: Negative.

## 2023-11-29 ENCOUNTER — Telehealth: Payer: Self-pay | Admitting: *Deleted

## 2023-12-13 ENCOUNTER — Other Ambulatory Visit: Payer: Self-pay | Admitting: Family Medicine

## 2023-12-13 DIAGNOSIS — Z1231 Encounter for screening mammogram for malignant neoplasm of breast: Secondary | ICD-10-CM

## 2023-12-23 ENCOUNTER — Ambulatory Visit
Admission: RE | Admit: 2023-12-23 | Discharge: 2023-12-23 | Disposition: A | Discharge status: Home / Self Care | Payer: Self-pay | Source: Ambulatory Visit | Attending: Family Medicine | Admitting: Family Medicine

## 2023-12-23 DIAGNOSIS — Z1231 Encounter for screening mammogram for malignant neoplasm of breast: Secondary | ICD-10-CM | POA: Insufficient documentation

## 2024-12-27 ENCOUNTER — Other Ambulatory Visit: Payer: Self-pay

## 2024-12-27 DIAGNOSIS — Z1231 Encounter for screening mammogram for malignant neoplasm of breast: Secondary | ICD-10-CM

## 2024-12-28 ENCOUNTER — Ambulatory Visit: Payer: Self-pay | Admitting: Nurse Practitioner

## 2024-12-28 ENCOUNTER — Ambulatory Visit
Admission: RE | Admit: 2024-12-28 | Discharge: 2024-12-28 | Disposition: A | Payer: Self-pay | Source: Ambulatory Visit | Attending: Obstetrics and Gynecology | Admitting: Obstetrics and Gynecology

## 2024-12-28 ENCOUNTER — Other Ambulatory Visit: Payer: Self-pay

## 2024-12-28 VITALS — BP 150/97 | Ht 64.0 in | Wt 178.0 lb

## 2024-12-28 DIAGNOSIS — Z1231 Encounter for screening mammogram for malignant neoplasm of breast: Secondary | ICD-10-CM | POA: Insufficient documentation

## 2024-12-28 DIAGNOSIS — R030 Elevated blood-pressure reading, without diagnosis of hypertension: Secondary | ICD-10-CM | POA: Insufficient documentation

## 2024-12-28 DIAGNOSIS — Z1211 Encounter for screening for malignant neoplasm of colon: Secondary | ICD-10-CM

## 2024-12-28 NOTE — Progress Notes (Signed)
 [name] [name] is a 54 y.o. female who presents to [facility] clinic today with no complaints.  Pap Smear: Pap not smear completed today. Last Pap smear was 10/13/22 at [facility] clinic and was normal. Per patient has no history of an abnormal Pap smear. Last Pap smear result is available in Epic.   Physical exam: Breasts Breasts symmetrical. No skin abnormalities bilateral breasts. No nipple retraction bilateral breasts. No nipple discharge bilateral breasts. No lymphadenopathy. No lumps palpated bilateral breasts.       Pelvic/Bimanual Pap is not indicated today    Smoking History: Patient has never smoked    Patient Navigation: Patient education provided. Access to services provided for patient through [facility] program. Alan interpreter provided. No transportation provided   Colorectal Cancer Screening: Per patient has never had colonoscopy completed No complaints today.    Breast and Cervical Cancer Risk Assessment: Patient does not have family history of breast cancer, known genetic mutations, or radiation treatment to the chest before age 39. Patient does not have history of cervical dysplasia, immunocompromised, or DES exposure in-utero.  Risk Scores as of Encounter on 12/28/2024     Shelby Garcia           5-year 0.96%   Lifetime 6.75%   This patient is Hisp[place]/Latina but has no documented birth country, so the [place] model used data from [place] patients to calculate their risk score. Document a birth country in the Demographics activity for a more accurate score.         Last calculated by Silas, Ansyi K, CMA on 12/28/2024 at  2:51 PM        A: [facility] exam without pap smear. No complaints. BP elevated in clinic 150/97. Declines recheck. Not on blood pressure medication currently but previously took medication.   P: Referred patient to the [facility] for screening mammogram. Plan to repeat pap in 2028. I recommend she follow up with Dr Olmedo/PCP to determine if  she should restart medication after monitoring her blood pressure readings at home. Appointment scheduled for today at 3:40pm. Fit Test provided today.   Dasie Tinnie MATSU, NP 12/28/2024
